# Patient Record
Sex: Female | Born: 1950 | Race: Black or African American | Hispanic: No | Marital: Single | Smoking: Current every day smoker
Health system: Southern US, Community
[De-identification: ages and names within clinical notes are randomized; demographics above are authoritative.]

## PROBLEM LIST (undated history)

## (undated) DIAGNOSIS — E785 Hyperlipidemia, unspecified: Secondary | ICD-10-CM

## (undated) DIAGNOSIS — K635 Polyp of colon: Secondary | ICD-10-CM

## (undated) DIAGNOSIS — I10 Essential (primary) hypertension: Secondary | ICD-10-CM

## (undated) DIAGNOSIS — E119 Type 2 diabetes mellitus without complications: Secondary | ICD-10-CM

## (undated) HISTORY — DX: Polyp of colon: K63.5

## (undated) HISTORY — DX: Hyperlipidemia, unspecified: E78.5

## (undated) HISTORY — DX: Essential (primary) hypertension: I10

## (undated) HISTORY — PX: CHOLECYSTECTOMY: SHX55

## (undated) HISTORY — DX: Type 2 diabetes mellitus without complications: E11.9

---

## 2005-01-04 ENCOUNTER — Ambulatory Visit: Payer: Self-pay | Admitting: Unknown Physician Specialty

## 2005-01-05 ENCOUNTER — Ambulatory Visit: Payer: Self-pay | Admitting: Unknown Physician Specialty

## 2005-02-05 ENCOUNTER — Ambulatory Visit: Payer: Self-pay | Admitting: Unknown Physician Specialty

## 2005-10-14 ENCOUNTER — Ambulatory Visit: Payer: Self-pay | Admitting: Family Medicine

## 2006-03-10 ENCOUNTER — Ambulatory Visit: Payer: Self-pay | Admitting: General Surgery

## 2007-05-17 ENCOUNTER — Ambulatory Visit: Payer: Self-pay

## 2007-08-27 ENCOUNTER — Ambulatory Visit: Payer: Self-pay

## 2008-07-22 ENCOUNTER — Ambulatory Visit: Payer: Self-pay

## 2008-08-11 ENCOUNTER — Ambulatory Visit: Payer: Self-pay | Admitting: General Surgery

## 2008-08-15 ENCOUNTER — Ambulatory Visit: Payer: Self-pay | Admitting: General Surgery

## 2008-11-07 HISTORY — PX: COLONOSCOPY: SHX174

## 2009-01-10 IMAGING — RF DG BE W/ AIR HIGH DENSITY
1 series · 15 of 18 positions shown · non-contrast
Comparison: none

REASON FOR EXAM: hx of colon polyps recent weight loss
COMMENTS:

[Series 1: run · 15 of 18 slices shown]
[im 1/18]
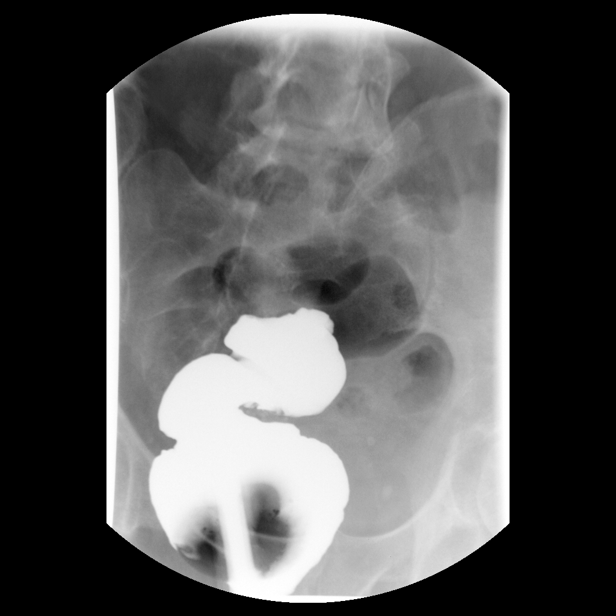
[im 2/18]
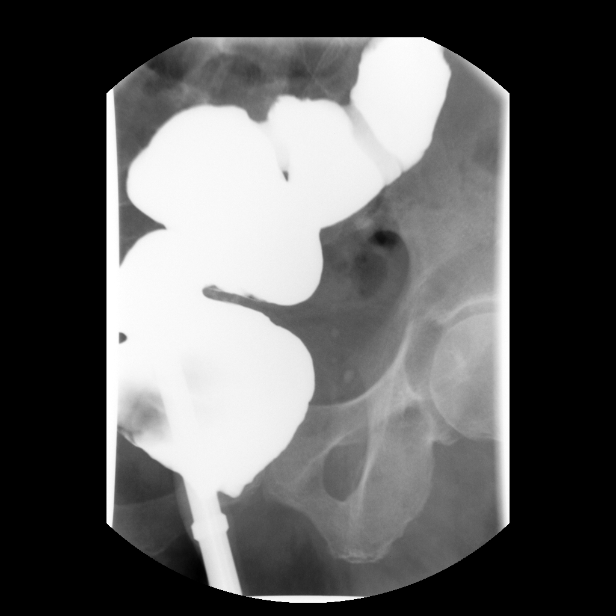
[im 4/18]
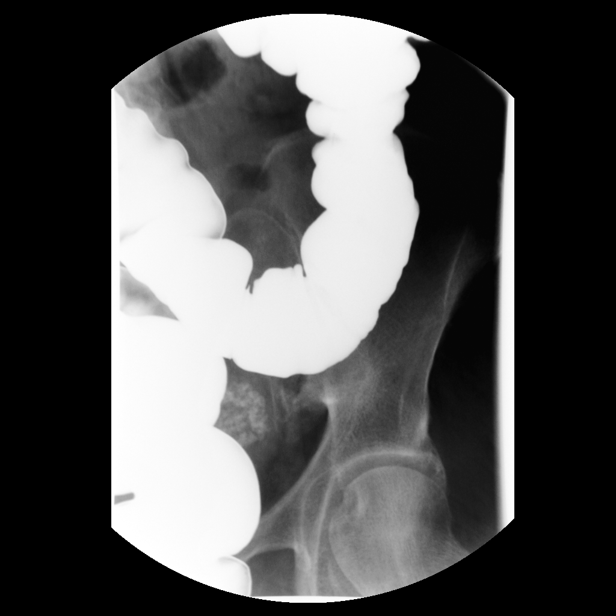
[im 5/18]
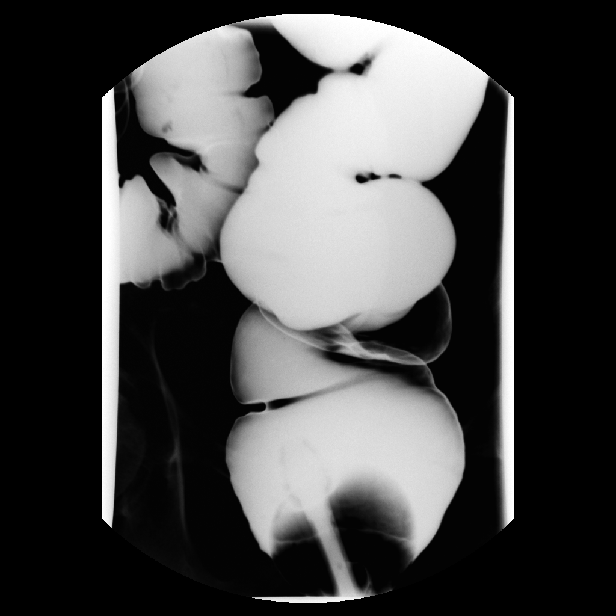
[im 6/18]
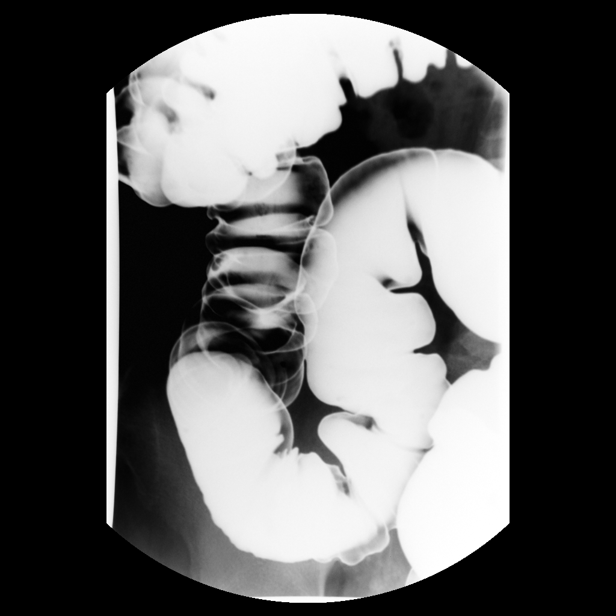
[im 7/18]
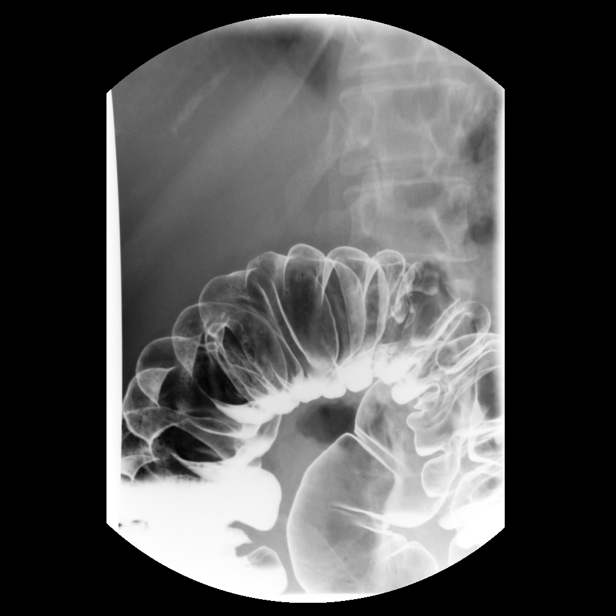
[im 8/18]
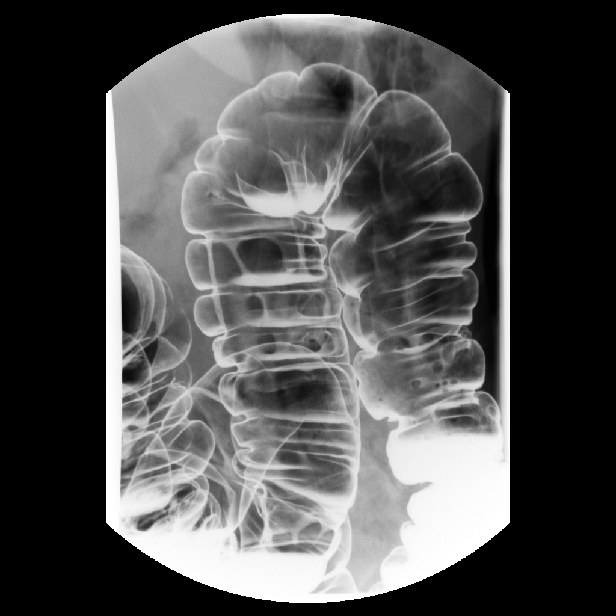
[im 10/18]
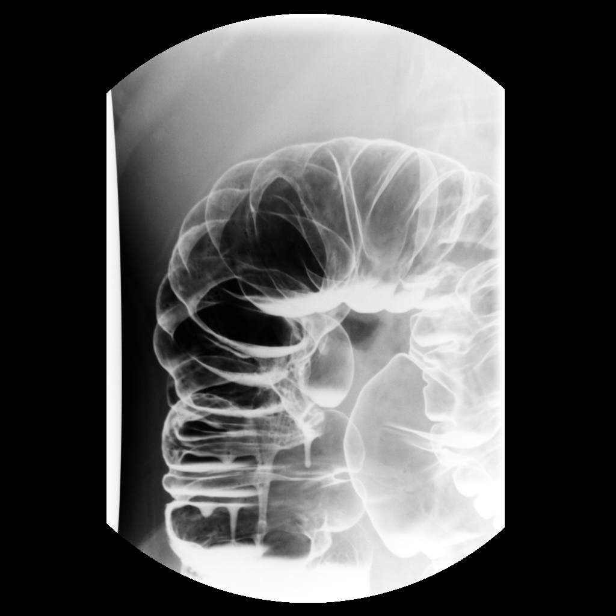
[im 11/18]
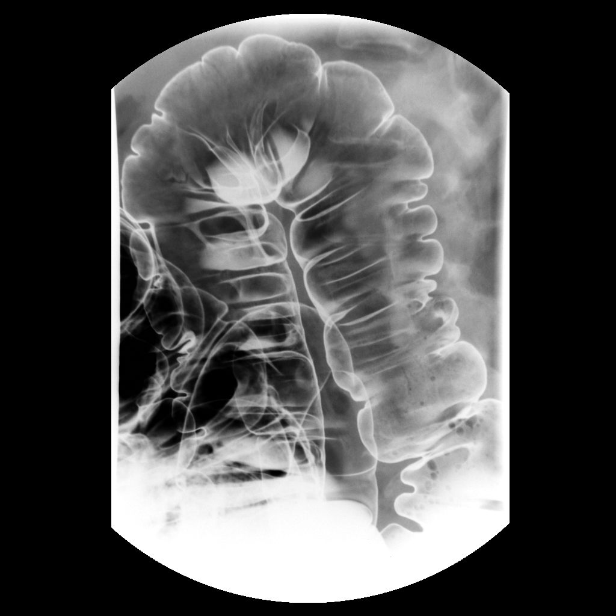
[im 12/18]
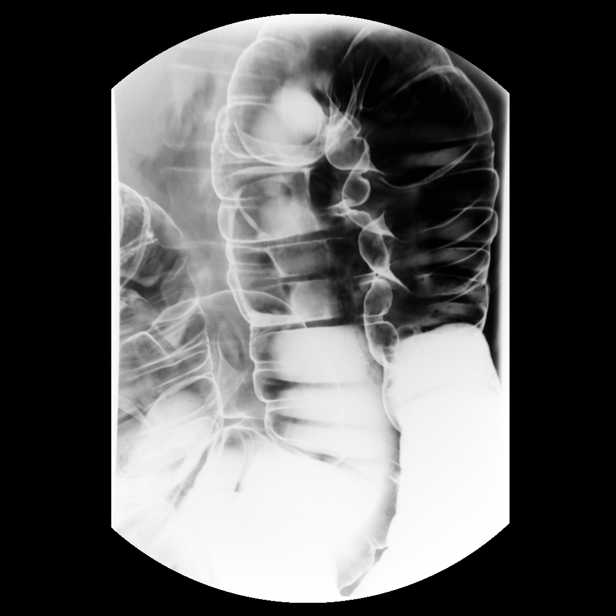
[im 13/18]
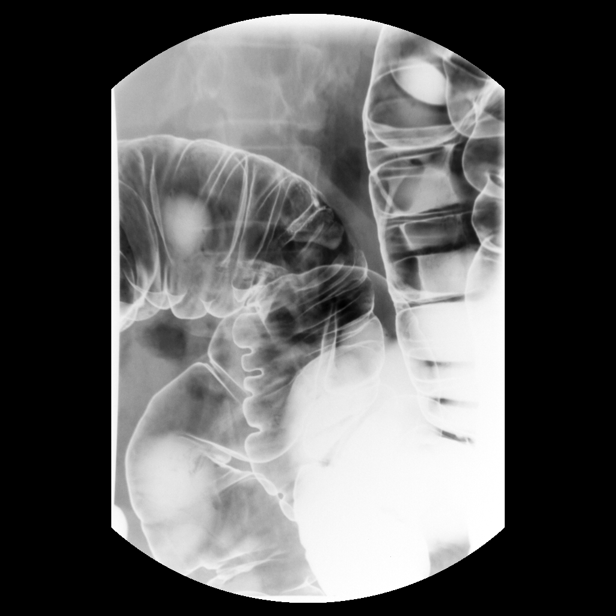
[im 14/18]
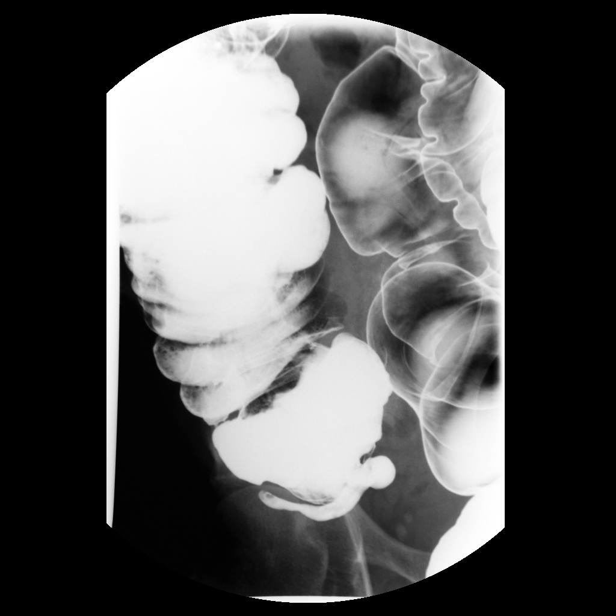
[im 16/18]
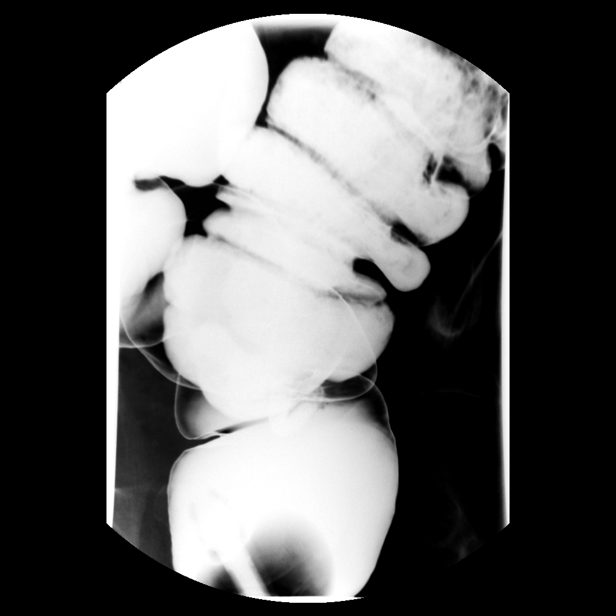
[im 17/18]
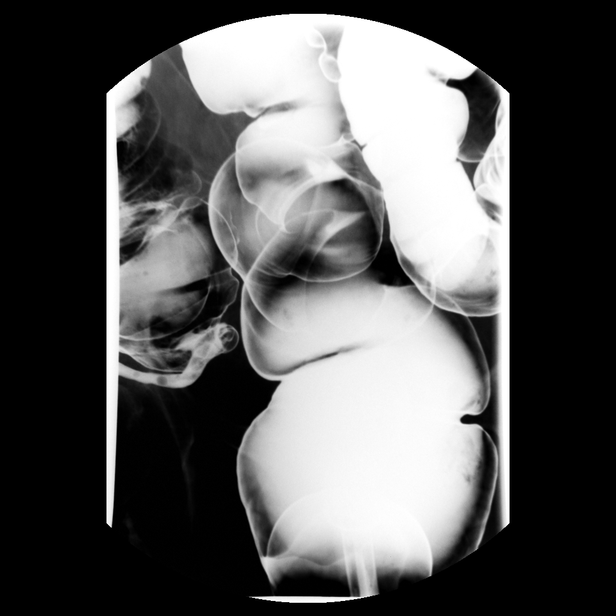
[im 18/18]
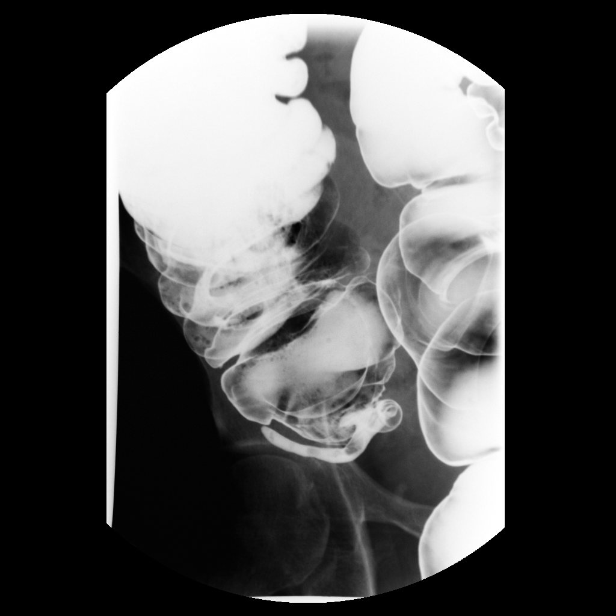

[15 of 18 positions shown; findings below may reference images not displayed]

PROCEDURE:     FL  - FL BARIUM ENEMA WITH AIR COLON  - August 15, 2008 [DATE]

RESULT:     Scout film reveals calcifications over the pelvis possibly
within a fibroid. Calcified pelvic phleboliths are also noted. Barium was
introduced per rectum to the level of the distal cecum and appendix and
terminal ileum. Filling defects are noted in the RIGHT colon. These cannot
be cleared on spot films and overhead films. Although this could represent
residual stool, given the patient's history, colonoscopy should be
reattempted particularly to evaluate the RIGHT colon for polypoid lesions.
No fixed polypoid lesions or mass lesion is noted in the transverse colon,
LEFT colon and rectosigmoid.
IMPRESSION: 1.     Fibroid uterus.
2.     Small polypoid filling defects in the mid to distal RIGHT colon.
These could not be completely cleared. This could represent residual stool
or polyps. Given the patient's history of polyps and weight loss, re-attempt
at colonoscopy to completely evaluate the RIGHT colon is suggested. The
remainder of the colon appeared clear.

## 2009-02-26 ENCOUNTER — Ambulatory Visit: Payer: Self-pay | Admitting: General Surgery

## 2009-10-19 ENCOUNTER — Ambulatory Visit: Payer: Self-pay | Admitting: Emergency Medicine

## 2010-03-21 ENCOUNTER — Emergency Department: Payer: Self-pay | Admitting: Emergency Medicine

## 2010-12-07 ENCOUNTER — Ambulatory Visit: Payer: Self-pay | Admitting: Family

## 2014-08-12 ENCOUNTER — Encounter: Payer: Self-pay | Admitting: General Surgery

## 2014-08-13 ENCOUNTER — Ambulatory Visit: Payer: Self-pay

## 2014-08-25 ENCOUNTER — Encounter: Payer: Self-pay | Admitting: General Surgery

## 2014-08-25 ENCOUNTER — Ambulatory Visit (INDEPENDENT_AMBULATORY_CARE_PROVIDER_SITE_OTHER): Payer: Medicare Other | Admitting: General Surgery

## 2014-08-25 VITALS — BP 110/80 | HR 84 | Resp 14 | Ht 66.0 in | Wt 119.0 lb

## 2014-08-25 DIAGNOSIS — Z86010 Personal history of colonic polyps: Secondary | ICD-10-CM

## 2014-08-25 MED ORDER — POLYETHYLENE GLYCOL 3350 17 GM/SCOOP PO POWD: ORAL | Status: AC

## 2014-08-25 NOTE — Progress Notes (Signed)
Patient ID: Terri Ochoa, female   DOB: [birth year], 63 y.o.   MRN: [id number]  Chief Complaint  Patient presents with  . Other    colonoscopy screening    HPI Terri Ochoa is a 63 y.o. female here today for a evaluation of surveillance colonoscopy. Patient last colonoscopy was done in 2010. The patient denies any problems at this time. She has a history of colon polyps.    HPI  Past Medical History  Diagnosis Date  . Diabetes mellitus without complication   . Hypertension   . Hyperlipidemia   . Colon polyps     Past Surgical History  Procedure Laterality Date  . Colonoscopy  2010  . Cholecystectomy      History reviewed. No pertinent family history.  Social History History  Substance Use Topics  . Smoking status: Current Every Day Smoker -- 1.00 packs/day for 30 years    Types: Cigarettes  . Smokeless tobacco: Not on file  . Alcohol Use: No    No Known Allergies  Current Outpatient Prescriptions  Medication Sig Dispense Refill  . aspirin 325 MG tablet Take 325 mg by mouth daily.      . insulin NPH Human (HUMULIN N,NOVOLIN N) 100 UNIT/ML injection 10 units in AM 12 units in PM      . lisinopril-hydrochlorothiazide (PRINZIDE,ZESTORETIC) 20-12.5 MG per tablet Take 1 tablet by mouth daily.      Marland Kitchen. lovastatin (MEVACOR) 20 MG tablet Take 20 mg by mouth at bedtime.      . metFORMIN (GLUCOPHAGE) 500 MG tablet Take 500 mg by mouth 2 (two) times daily with a meal.      . polyethylene glycol powder (GLYCOLAX/MIRALAX) powder 255 grams one bottle for colonoscopy prep  255 g  0   No current facility-administered medications for this visit.    Review of Systems Review of Systems  Constitutional: Negative.   Respiratory: Negative.   Cardiovascular: Negative.   Gastrointestinal: Negative.     Blood pressure 110/80, pulse 84, resp. rate 14, height 5\' 6"  (1.676 m), weight 119 lb (53.978 kg).  Physical Exam Physical Exam  Constitutional: She is oriented to person,  place, and time. She appears well-developed and well-nourished.  Eyes: Conjunctivae are normal. No scleral icterus.  Neck: Neck supple. No thyromegaly present.  Cardiovascular: Normal rate, regular rhythm and normal heart sounds.   No murmur heard. Pulmonary/Chest: Effort normal and breath sounds normal.  Lymphadenopathy:    She has no cervical adenopathy.  Neurological: She is alert and oriented to person, place, and time.  Skin: Skin is warm and dry.    Data Reviewed Prior notes  Assessment    History of colon polyps. Surveillance colonoscopy due. Pt agreeable.     Plan    Patient [facility]. This patient will not take metformin the day of prep or procedure. Patient may take morning dose of insulin the day of prep but will not take the evening dose. Patient will only take blood pressure medication the morning of scheduled colonoscopy.        SANKAR,SEEPLAPUTHUR G 08/26/2014, 7:59 AM

## 2014-08-25 NOTE — Patient Instructions (Addendum)
Colonoscopy A colonoscopy is an exam to look at the entire large intestine (colon). This exam can help find problems such as tumors, polyps, inflammation, and areas of bleeding. The exam takes about 1 hour.  LET [facility]ARE PROVIDER KNOW ABOUT:   Any allergies you have.  All medicines you are taking, including vitamins, herbs, eye drops, creams, and over-the-counter medicines.  Previous problems you or members of your family have had with the use of anesthetics.  Any blood disorders you have.  Previous surgeries you have had.  Medical conditions you have. RISKS AND COMPLICATIONS  Generally, this is a safe procedure. However, as with any procedure, complications can occur. Possible complications include:  Bleeding.  Tearing or rupture of the colon wall.  Reaction to medicines given during the exam.  Infection (rare). BEFORE THE PROCEDURE   Ask your health care provider about changing or stopping your regular medicines.  You may be prescribed an oral bowel prep. This involves drinking a large amount of medicated liquid, starting the day before your procedure. The liquid will cause you to have multiple loose stools until your stool is almost clear or light green. This cleans out your colon in preparation for the procedure.  Do not eat or drink anything else once you have started the bowel prep, unless your health care provider tells you it is safe to do so.  Arrange for someone to drive you home after the procedure. PROCEDURE   You will be given medicine to help you relax (sedative).  You will lie on your side with your knees bent.  A long, flexible tube with a light and camera on the end (colonoscope) will be inserted through the rectum and into the colon. The camera sends video back to a computer screen as it moves through the colon. The colonoscope also releases carbon dioxide gas to inflate the colon. This helps your health care provider see the area better.  During  the exam, your health care provider may take a small tissue sample (biopsy) to be examined under a microscope if any abnormalities are found.  The exam is finished when the entire colon has been viewed. AFTER THE PROCEDURE   Do not drive for 24 hours after the exam.  You may have a small amount of blood in your stool.  You may pass moderate amounts of gas and have mild abdominal cramping or bloating. This is caused by the gas used to inflate your colon during the exam.  Ask when your test results will be ready and how you will get your results. Make sure you get your test results. Document Released: 10/21/2000 Document Revised: 08/14/2013 Document Reviewed: 07/01/2013 [facility] Patient Information 2015 [employer] [place]. This information is not intended to replace advice given to you by your health care provider. Make sure you discuss any questions you have with your health care provider.  [facility]. This patient will not take metformin the day of prep or procedure. Patient may take morning dose of insulin the day of prep but will not take the evening dose. Patient will only take blood pressure medication the morning of scheduled colonoscopy.

## 2014-08-26 ENCOUNTER — Encounter: Payer: Self-pay | Admitting: General Surgery

## 2014-09-08 ENCOUNTER — Other Ambulatory Visit: Payer: Self-pay | Admitting: General Surgery

## 2014-09-08 DIAGNOSIS — Z86010 Personal history of colonic polyps: Secondary | ICD-10-CM

## 2014-09-09 ENCOUNTER — Telehealth: Payer: Self-pay

## 2014-09-09 NOTE — Telephone Encounter (Signed)
Message received from Dr Evette CristalSankar for patient to hold her 325 mg ASA today and tomorrow for her colonoscopy scheduled for 09/10/14. Patient is amendable to this and is aware of instructions.

## 2014-09-10 ENCOUNTER — Ambulatory Visit: Payer: Self-pay | Admitting: General Surgery

## 2014-09-10 DIAGNOSIS — Z86010 Personal history of colonic polyps: Secondary | ICD-10-CM

## 2014-09-11 ENCOUNTER — Encounter: Payer: Self-pay | Admitting: General Surgery

## 2016-06-20 ENCOUNTER — Other Ambulatory Visit: Payer: Self-pay | Admitting: Obstetrics and Gynecology

## 2016-06-20 DIAGNOSIS — Z1231 Encounter for screening mammogram for malignant neoplasm of breast: Secondary | ICD-10-CM

## 2017-07-13 ENCOUNTER — Encounter: Payer: Self-pay | Admitting: Emergency Medicine

## 2017-07-13 ENCOUNTER — Emergency Department: Payer: Medicare PPO

## 2017-07-13 ENCOUNTER — Emergency Department
Admission: EM | Admit: 2017-07-13 | Discharge: 2017-07-13 | Disposition: A | Discharge status: Home / Self Care | Payer: Medicare PPO | Attending: Emergency Medicine | Admitting: Emergency Medicine

## 2017-07-13 DIAGNOSIS — Z23 Encounter for immunization: Secondary | ICD-10-CM | POA: Insufficient documentation

## 2017-07-13 DIAGNOSIS — S61211A Laceration without foreign body of left index finger without damage to nail, initial encounter: Secondary | ICD-10-CM | POA: Diagnosis not present

## 2017-07-13 DIAGNOSIS — Y92007 Garden or yard of unspecified non-institutional (private) residence as the place of occurrence of the external cause: Secondary | ICD-10-CM | POA: Insufficient documentation

## 2017-07-13 DIAGNOSIS — Z794 Long term (current) use of insulin: Secondary | ICD-10-CM | POA: Diagnosis not present

## 2017-07-13 DIAGNOSIS — S61213A Laceration without foreign body of left middle finger without damage to nail, initial encounter: Secondary | ICD-10-CM | POA: Insufficient documentation

## 2017-07-13 DIAGNOSIS — Z79899 Other long term (current) drug therapy: Secondary | ICD-10-CM | POA: Insufficient documentation

## 2017-07-13 DIAGNOSIS — Y999 Unspecified external cause status: Secondary | ICD-10-CM | POA: Diagnosis not present

## 2017-07-13 DIAGNOSIS — I10 Essential (primary) hypertension: Secondary | ICD-10-CM | POA: Insufficient documentation

## 2017-07-13 DIAGNOSIS — Y93H2 Activity, gardening and landscaping: Secondary | ICD-10-CM | POA: Diagnosis not present

## 2017-07-13 DIAGNOSIS — W293XXA Contact with powered garden and outdoor hand tools and machinery, initial encounter: Secondary | ICD-10-CM | POA: Diagnosis not present

## 2017-07-13 DIAGNOSIS — S61217A Laceration without foreign body of left little finger without damage to nail, initial encounter: Secondary | ICD-10-CM | POA: Diagnosis not present

## 2017-07-13 DIAGNOSIS — F17210 Nicotine dependence, cigarettes, uncomplicated: Secondary | ICD-10-CM | POA: Insufficient documentation

## 2017-07-13 DIAGNOSIS — S61215A Laceration without foreign body of left ring finger without damage to nail, initial encounter: Secondary | ICD-10-CM

## 2017-07-13 DIAGNOSIS — E119 Type 2 diabetes mellitus without complications: Secondary | ICD-10-CM | POA: Diagnosis not present

## 2017-07-13 DIAGNOSIS — Z7982 Long term (current) use of aspirin: Secondary | ICD-10-CM | POA: Insufficient documentation

## 2017-07-13 MED ORDER — LIDOCAINE HCL (PF) 1 % IJ SOLN
10.0000 mL | Freq: Once | INTRAMUSCULAR | Status: AC
  Administered 2017-07-13: 10 mL
  Filled 2017-07-13: qty 10

## 2017-07-13 MED ORDER — TRAMADOL HCL 50 MG PO TABS
50.0000 mg | ORAL_TABLET | Freq: Once | ORAL | Status: AC
  Administered 2017-07-13: 50 mg via ORAL
  Filled 2017-07-13: qty 1

## 2017-07-13 MED ORDER — TETANUS-DIPHTH-ACELL PERTUSSIS 5-2.5-18.5 LF-MCG/0.5 IM SUSP
0.5000 mL | Freq: Once | INTRAMUSCULAR | Status: AC
  Administered 2017-07-13: 0.5 mL via INTRAMUSCULAR
  Filled 2017-07-13: qty 0.5

## 2017-07-13 MED ORDER — SULFAMETHOXAZOLE-TRIMETHOPRIM 800-160 MG PO TABS
1.0000 | ORAL_TABLET | Freq: Once | ORAL | Status: AC
  Administered 2017-07-13: 1 via ORAL
  Filled 2017-07-13: qty 1

## 2017-07-13 MED ORDER — SULFAMETHOXAZOLE-TRIMETHOPRIM 800-160 MG PO TABS: 1.0000 | ORAL_TABLET | Freq: Two times a day (BID) | ORAL | 0 refills | Status: AC

## 2017-07-13 MED ORDER — TRAMADOL HCL 50 MG PO TABS: 50.0000 mg | ORAL_TABLET | Freq: Two times a day (BID) | ORAL | 0 refills | Status: AC

## 2017-07-13 NOTE — ED Triage Notes (Signed)
Patient presents to ED via POV from home. Patient was doing yard work, patient states, "I smashed the button on the weed wacker and I forgot my hand was on the blade". Significant trauma noted to four left fingers (all except thumb). Worst injury noted to left ring finger. Soft tissue visible.

## 2017-07-13 NOTE — ED Notes (Signed)
Pt ambulatory to toilet by self.  

## 2017-07-13 NOTE — Discharge Instructions (Signed)
Your multiple finger lacerations have been either sutured (stitches) or glued with wound adhesive. Keep the wounds clean, dry, and covered. [facility] in 10-12 days. Take Tylenol or Motrin for pain relief.

## 2017-07-13 NOTE — ED Notes (Signed)
See triage note  States she was using a weed wacker while doing yard work  States she forgot and hit the button  Lacerations noted to 2nd,3rd ,4th and 5 th fingers

## 2017-07-14 NOTE — ED Provider Notes (Signed)
[facility] Emergency Department Provider Note ____________________________________________  Time seen: 1532  I have reviewed the triage vital signs and the nursing notes.  HISTORY  Chief Complaint  Laceration  HPI Terri Ochoa is a 66 y.o. female Presents to the ED from home, for evaluation and management of lacerations to several fingers. Patient describes she was using a weed whacker, and forgot that her hand was near the blade when she engaged the safety button. She sustained multiple lacerations across all 4 of herfingers. She presents now with bleeding controlled, for evaluation management. She is unclear for current tetanus status. She does report normal range of motion and gross sensation to all the fingers. She has history of diabetes and hypertension.  Past Medical History:  Diagnosis Date  . Colon polyps   . Diabetes mellitus without complication (HCC)   . Hyperlipidemia   . Hypertension     There are no active problems to display for this patient.   Past Surgical History:  Procedure Laterality Date  . CHOLECYSTECTOMY    . COLONOSCOPY  2010    Prior to Admission medications   Medication Sig Start Date End Date Taking? Authorizing Provider  aspirin 325 MG tablet Take 325 mg by mouth daily.    [provider]  insulin NPH Human (HUMULIN N,NOVOLIN N) 100 UNIT/ML injection 10 units in AM 12 units in PM    [provider]  lisinopril-hydrochlorothiazide (PRINZIDE,ZESTORETIC) 20-12.5 MG per tablet Take 1 tablet by mouth daily.    [provider]  lovastatin (MEVACOR) 20 MG tablet Take 20 mg by mouth at bedtime.    [provider]  metFORMIN (GLUCOPHAGE) 500 MG tablet Take 500 mg by mouth 2 (two) times daily with a meal.    [provider]  polyethylene glycol powder (GLYCOLAX/MIRALAX) powder 255 grams one bottle for colonoscopy prep 08/25/14   Kieth Brightly, MD  sulfamethoxazole-trimethoprim  (BACTRIM DS,SEPTRA DS) 800-160 MG tablet Take 1 tablet by mouth 2 (two) times daily. 07/13/17   Jonanthony Nahar, Charlesetta Ivory, PA-C  traMADol (ULTRAM) 50 MG tablet Take 1 tablet (50 mg total) by mouth 2 (two) times daily. 07/13/17   Robertson Colclough, Charlesetta Ivory, PA-C    Allergies Patient has no known allergies.  No family history on file.  Social History Social History  Substance Use Topics  . Smoking status: Current Every Day Smoker    Packs/day: 1.00    Years: 30.00    Types: Cigarettes  . Smokeless tobacco: Never Used  . Alcohol use No    Review of Systems  Constitutional: Negative for fever. Musculoskeletal: Negative for back pain. Skin: Negative for rash. Multiple lacerations to her fingers of the left hand. Neurological: Negative for headaches, focal weakness or numbness. ____________________________________________  PHYSICAL EXAM:  VITAL SIGNS: ED Triage Vitals  Enc Vitals Group     BP 07/13/17 1523 (!) 148/70     Pulse Rate 07/13/17 1523 99     Resp 07/13/17 1523 17     Temp 07/13/17 1523 98.1 F (36.7 C)     Temp src --      SpO2 07/13/17 1523 95 %     Weight 07/13/17 1523 120 lb (54.4 kg)     Height 07/13/17 1523  (1.676 m)     Head Circumference --      Peak Flow --      Pain Score 07/13/17 1522 10     Pain Loc --  Pain Edu? --      Excl. in GC? --     Constitutional: Alert and oriented. Well appearing and in no distress. Head: Normocephalic and atraumatic. Cardiovascular: Normal rate, regular rhythm. Normal distal pulses. Respiratory: Normal respiratory effort. No wheezes/rales/rhonchi. Musculoskeletal: Normal composite fist on the left.Nontender with normal range of motion in all extremities.  Neurologic:  Normal gross sensation. Normal intrinsic and opposition testing. Normal speech and language. No gross focal neurologic deficits are appreciated. Skin:  Skin is warm, dry and intact. No rash noted. ___________________________________________    RADIOLOGY  Left Hand  IMPRESSION: 1. Lacerations in the distal fingers. 2. High attenuation focus adjacent to the distal tuft of the fifth finger could represent soft tissue calcification versus of foreign body given the lacerations. 3. High attenuation in the soft tissues adjacent to the distal third interphalangeal joint appears to be associated with the joint and could represent a subtle fracture or degenerative change. Recommend clinical correlation.  I, Bary Limbach, Charlesetta IvoryJenise V Bacon, personally viewed and evaluated these images (plain radiographs) as part of my medical decision making, as well as reviewing the written report by the radiologist. ____________________________________________  PROCEDURES  Tdap 0.5 ml IM Bactrim DS 1 PO Ultram 50 mg PO  LACERATION REPAIR Performed by: Lester CarolinaMicheal Whitehurst, PA-S Sherrie Sport([place]) Assisted by: Lissa HoardMenshew, Larraine Argo V Bacon Authorized by: Lissa HoardMenshew, Gabbie Marzo V Bacon Consent: Verbal consent obtained. Risks and benefits: risks, benefits and alternatives were discussed Consent given by: patient Patient identity confirmed: provided demographic data Prepped and Draped in normal sterile fashion Wound explored  Laceration Location: left index/middle/ring/pinky (2nd-5th)  Laceration Length: index 1.5 total linear cm                                Middle 2 total linear cm                                Ring 5 total linear cm                                Pinky 3 total linear cm  No Foreign Bodies seen or palpated  Anesthesia: transthecal infiltration  Local anesthetic: lidocaine 1% w/o epinephrine  Anesthetic total: 10 ml  Irrigation method: syringe Amount of cleaning: standard  Skin closure: 5-0 nylon + Dermabond  Number of sutures: 33 total   Technique: interrupted  Patient tolerance: Patient tolerated the procedure well with no immediate complications. Adequate skin edge approximation was achieved of the very complex, tedious multiple  lacerations across 4 fingers.  ____________________________________________  INITIAL IMPRESSION / ASSESSMENT AND PLAN / ED COURSE  Patient was ED evaluation of multiple superficial lacerations involving the fingertips and fat pads of her left second through fifth fingers. Patient tolerates procedure well and adequate wound repair is achieved. She is discharged with a prescription for Ultram and Bactrim dose as directed. Wound care structures are provided, and she will follow with her primary care provider for ongoing wound check and subsequent suture removal. ____________________________________________  FINAL CLINICAL IMPRESSION(S) / ED DIAGNOSES  Final diagnoses:  Laceration of left ring finger without foreign body without damage to nail, initial encounter  Laceration of left index finger without foreign body without damage to nail, initial encounter  Laceration of left middle finger without foreign body without damage to nail, initial encounter  Laceration of left little finger without foreign body without damage to nail, initial encounter      Lissa Hoard, PA-C 07/14/17 0154    Rockne Menghini, MD 07/18/17 2137

## 2017-09-25 ENCOUNTER — Other Ambulatory Visit: Payer: Self-pay | Admitting: Obstetrics and Gynecology

## 2017-09-25 DIAGNOSIS — Z1231 Encounter for screening mammogram for malignant neoplasm of breast: Secondary | ICD-10-CM

## 2017-10-17 ENCOUNTER — Inpatient Hospital Stay: Admission: RE | Admit: 2017-10-17 | Payer: Medicare PPO | Source: Ambulatory Visit

## 2017-12-08 IMAGING — DX DG HAND COMPLETE 3+V*L*
3 series · 3 of 3 positions shown · non-contrast
Comparison: None.

CLINICAL DATA: Lacerations.

EXAM:
LEFT HAND - COMPLETE 3+ VIEW

[hand ap]
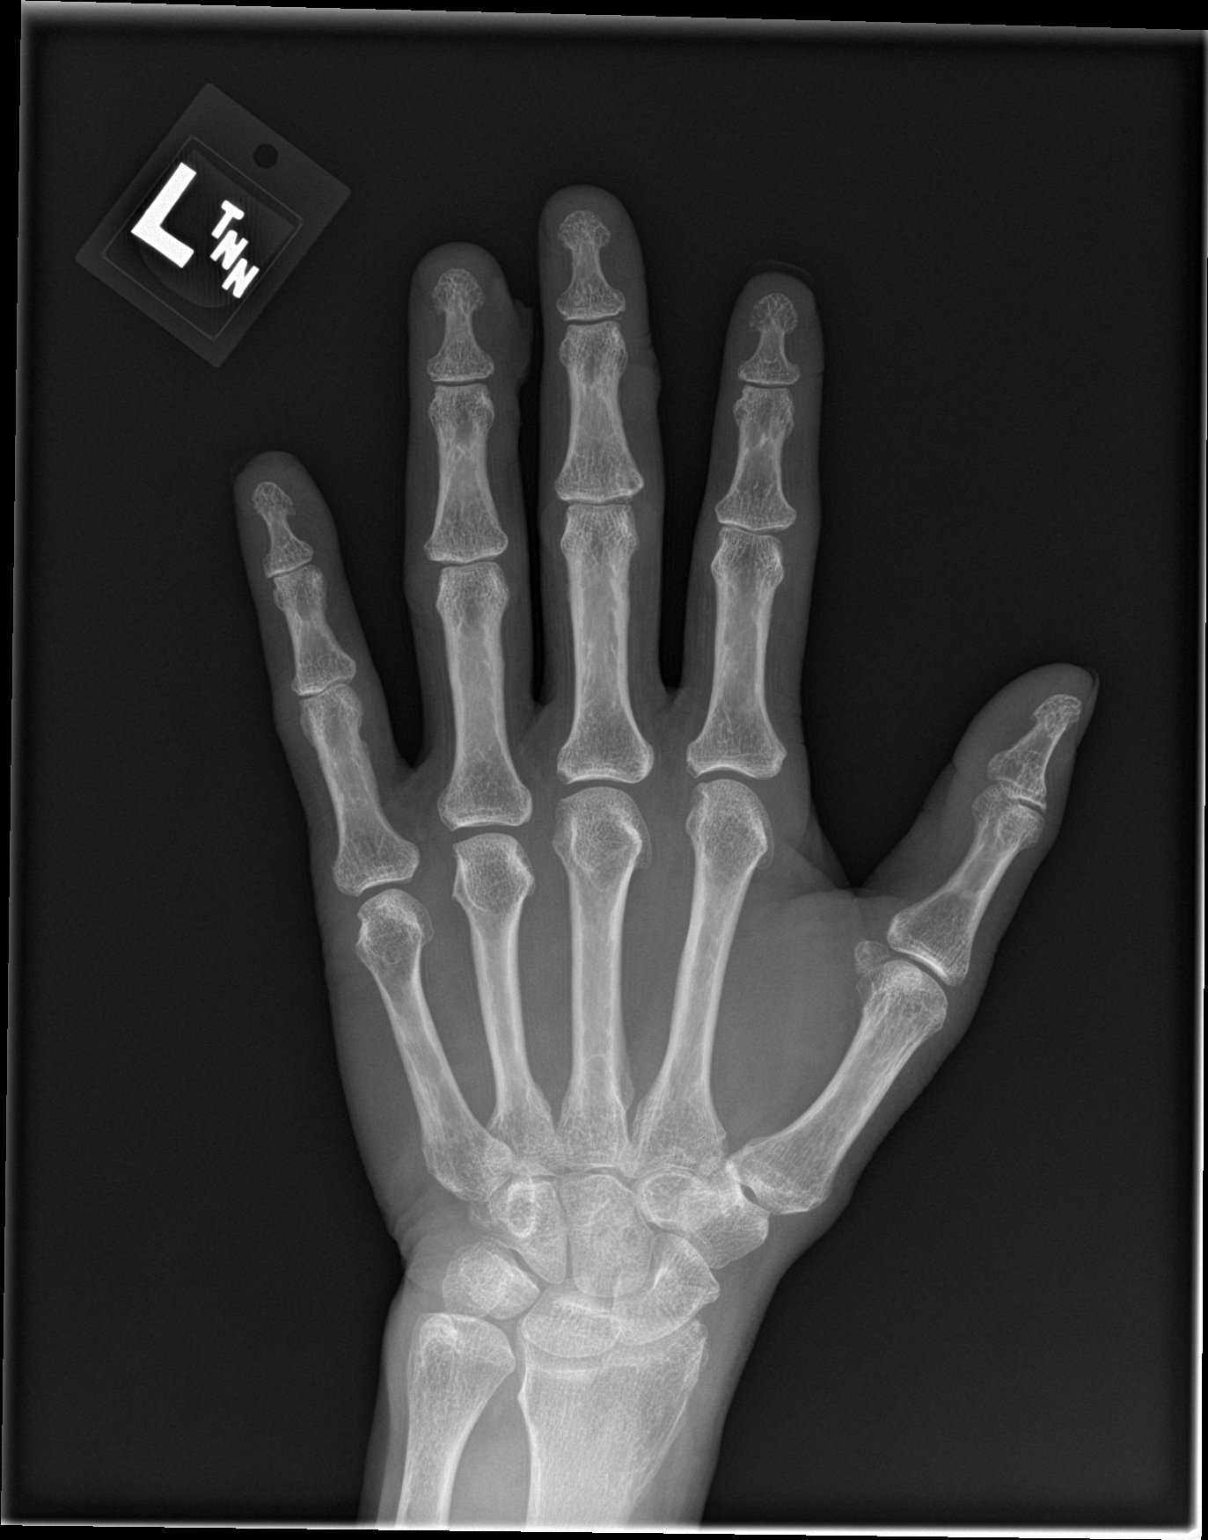

[hand obl]
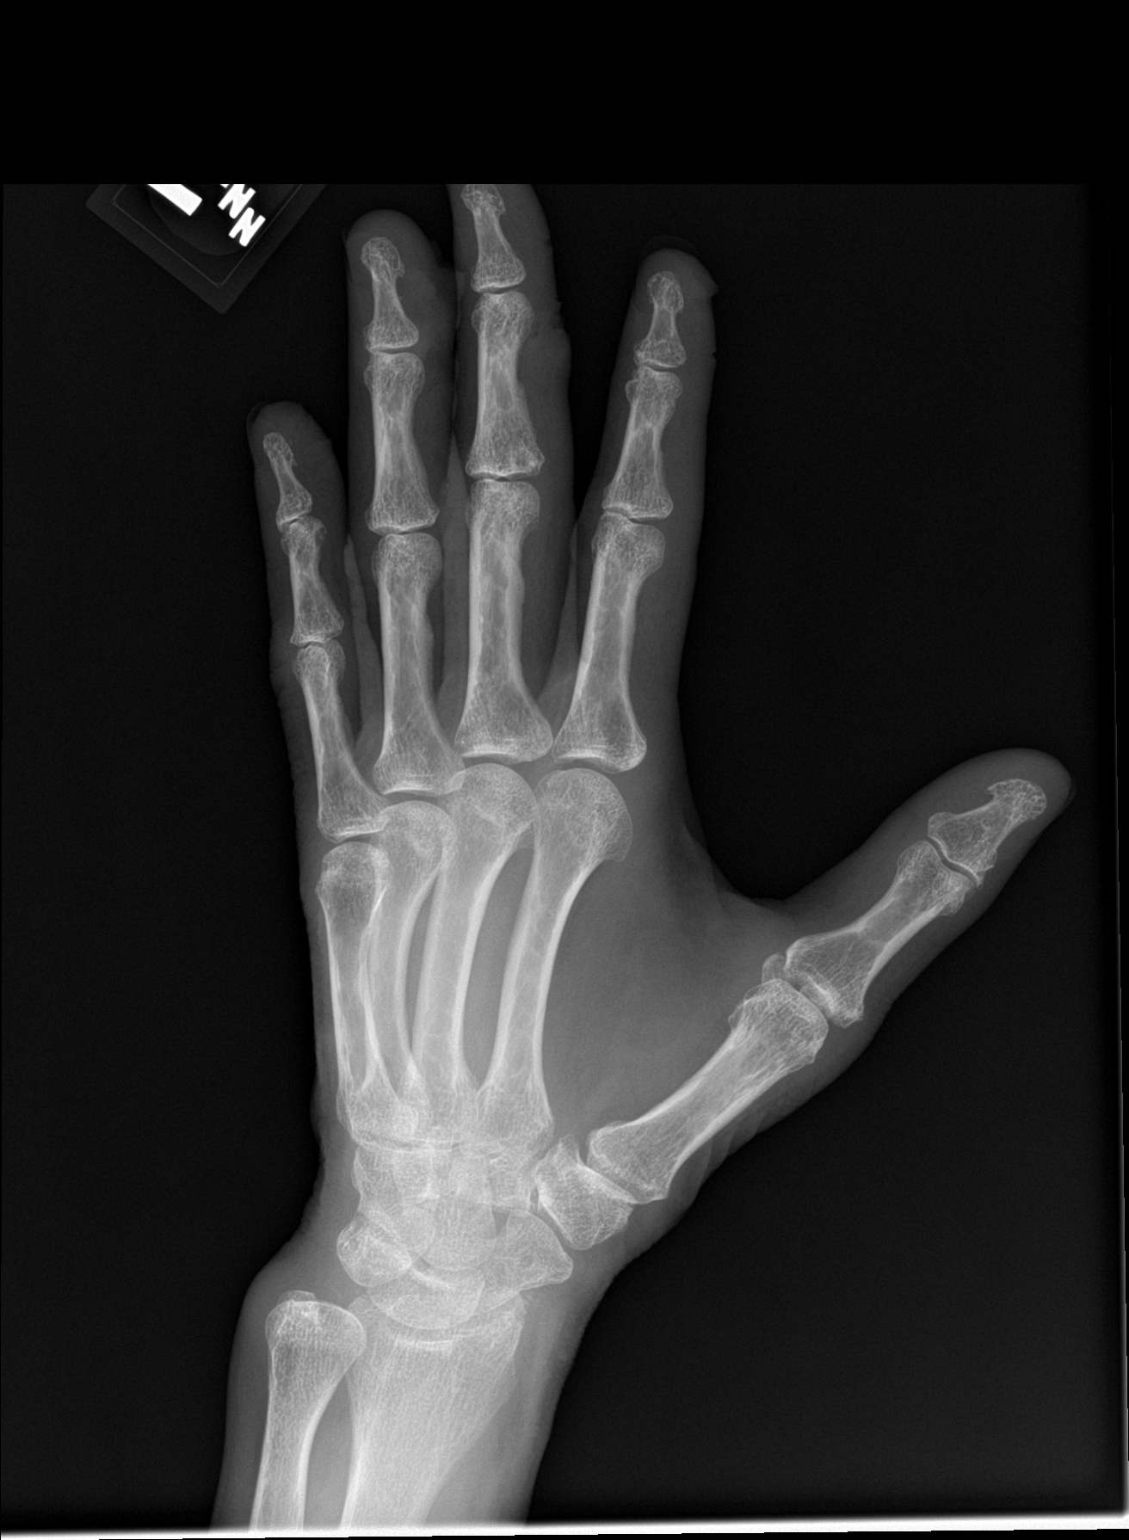

[hand lat]
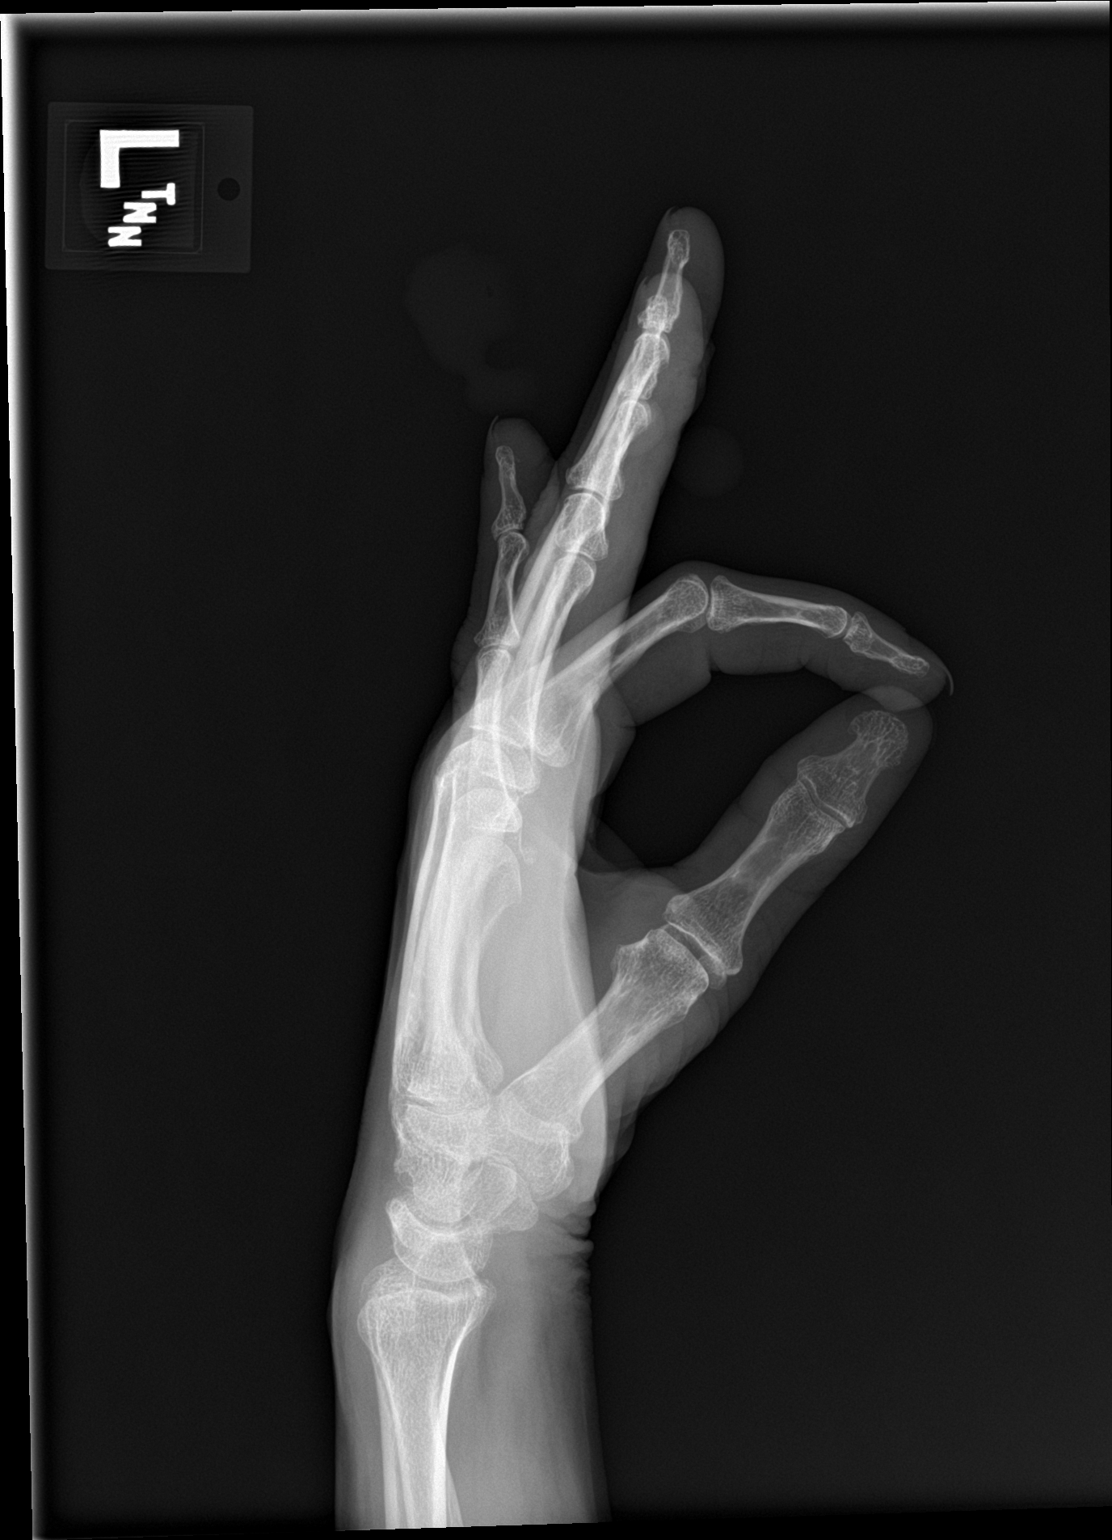

[3 of 3 positions shown; findings below may reference images not displayed]

FINDINGS: Lacerations are seen in the fingers distally. High attenuation
adjacent to the distal tuft of the fifth phalanx on two views is
nonspecific but could represent a foreign body or soft tissue
calcification. A tiny calcification along the radial aspect of the
third distal interphalangeal joint is best seen on the AP view. No
other abnormalities.
IMPRESSION: 1. Lacerations in the distal fingers.
2. High attenuation focus adjacent to the distal tuft of the fifth
finger could represent soft tissue calcification versus of foreign
body given the lacerations.
3. High attenuation in the soft tissues adjacent to the distal third
interphalangeal joint appears to be associated with the joint and
could represent a subtle fracture or degenerative change. Recommend
clinical correlation.

## 2018-05-21 ENCOUNTER — Other Ambulatory Visit: Payer: Self-pay | Admitting: Obstetrics and Gynecology

## 2018-05-21 DIAGNOSIS — Z1231 Encounter for screening mammogram for malignant neoplasm of breast: Secondary | ICD-10-CM

## 2019-09-24 ENCOUNTER — Other Ambulatory Visit: Payer: Self-pay | Admitting: Obstetrics and Gynecology

## 2019-09-24 DIAGNOSIS — Z1231 Encounter for screening mammogram for malignant neoplasm of breast: Secondary | ICD-10-CM

## 2019-11-28 ENCOUNTER — Other Ambulatory Visit: Payer: Self-pay | Admitting: Obstetrics and Gynecology

## 2019-11-28 DIAGNOSIS — Z1231 Encounter for screening mammogram for malignant neoplasm of breast: Secondary | ICD-10-CM

## 2022-08-11 ENCOUNTER — Other Ambulatory Visit: Payer: Self-pay | Admitting: Obstetrics and Gynecology

## 2022-08-11 DIAGNOSIS — Z1231 Encounter for screening mammogram for malignant neoplasm of breast: Secondary | ICD-10-CM

## 2022-09-16 ENCOUNTER — Other Ambulatory Visit: Payer: Self-pay | Admitting: *Deleted

## 2022-09-16 DIAGNOSIS — F17210 Nicotine dependence, cigarettes, uncomplicated: Secondary | ICD-10-CM

## 2022-09-16 DIAGNOSIS — Z87891 Personal history of nicotine dependence: Secondary | ICD-10-CM

## 2022-09-16 DIAGNOSIS — Z122 Encounter for screening for malignant neoplasm of respiratory organs: Secondary | ICD-10-CM

## 2022-10-26 ENCOUNTER — Encounter: Payer: Self-pay | Admitting: Acute Care

## 2022-10-26 ENCOUNTER — Ambulatory Visit (INDEPENDENT_AMBULATORY_CARE_PROVIDER_SITE_OTHER): Payer: Medicare PPO | Admitting: Acute Care

## 2022-10-26 DIAGNOSIS — F17210 Nicotine dependence, cigarettes, uncomplicated: Secondary | ICD-10-CM | POA: Diagnosis not present

## 2022-10-26 NOTE — Patient Instructions (Signed)
Thank you for participating in the [place] [facility]. It was our pleasure to meet you today. We will call you with the results of your scan within the next few days. Your scan will be assigned a Lung RADS category score by the physicians reading the scans.  This Lung RADS score determines follow up scanning.  See below for description of categories, and follow up screening recommendations. We will be in touch to schedule your follow up screening annually or based on recommendations of our providers. We will fax a copy of your scan results to your Primary Care Physician, or the physician who referred you to the program, to ensure they have the results. Please call the office if you have any questions or concerns regarding your scanning experience or results.  Our office number is [phone]. Please speak with Denise Phelps, RN. , or  Denise Buckner RN, They are  our Lung Cancer Screening RN.'s If They are unavailable when you call, Please leave a message on the voice mail. We will return your call at our earliest convenience.This voice mail is monitored several times a day.  Remember, if your scan is normal, we will scan you annually as long as you continue to meet the criteria for the program. (Age 55-77, Current smoker or smoker who has quit within the last 15 years). If you are a smoker, remember, quitting is the single most powerful action that you can take to decrease your risk of lung cancer and other pulmonary, breathing related problems. We know quitting is hard, and we are here to help.  Please let us know if there is anything we can do to help you meet your goal of quitting. If you are a former smoker, congratulations. We are proud of you! Remain smoke free! Remember you can refer friends or family members through the number above.  We will screen them to make sure they meet criteria for the program. Thank you for helping us take better care of you by  participating in Lung Screening.  You can receive free nicotine replacement therapy ( patches, gum or mints) by calling 1-800-QUIT NOW. Please call so we can get you on the path to becoming  a non-smoker. I know it is hard, but you can do this!  Lung RADS Categories:  Lung RADS 1: no nodules or definitely non-concerning nodules.  Recommendation is for a repeat annual scan in 12 months.  Lung RADS 2:  nodules that are non-concerning in appearance and behavior with a very low likelihood of becoming an active cancer. Recommendation is for a repeat annual scan in 12 months.  Lung RADS 3: nodules that are probably non-concerning , includes nodules with a low likelihood of becoming an active cancer.  Recommendation is for a 6-month repeat screening scan. Often noted after an upper respiratory illness. We will be in touch to make sure you have no questions, and to schedule your 6-month scan.  Lung RADS 4 A: nodules with concerning findings, recommendation is most often for a follow up scan in 3 months or additional testing based on our provider's assessment of the scan. We will be in touch to make sure you have no questions and to schedule the recommended 3 month follow up scan.  Lung RADS 4 B:  indicates findings that are concerning. We will be in touch with you to schedule additional diagnostic testing based on our provider's  assessment of the scan.  Other options for assistance in smoking cessation (   As covered by your insurance benefits)  Hypnosis for smoking cessation  [employer] [phone]  Acupuncture for smoking cessation  [facility] [phone]   

## 2022-10-26 NOTE — Progress Notes (Signed)
Virtual Visit via Telephone Note  I connected with Skyah Hannon Mahadeo on 10/26/22 at  8:30 AM EST by telephone and verified that I am speaking with the correct person using two identifiers.  Location: Patient:  At home Provider: [address], [place], [place], [address]    I discussed the limitations, risks, security and privacy concerns of performing an evaluation and management service by telephone and the availability of in person appointments. I also discussed with the patient that there may be a patient responsible charge related to this service. The patient expressed understanding and agreed to proceed.   Shared Decision Making Visit Lung Cancer Screening Program [phone])   Eligibility: Age 71 y.o. Pack Years Smoking History Calculation 20 pack year smoking history (# packs/per year x # years smoked) Recent History of coughing up blood  no Unexplained weight loss? no ( >Than 15 pounds within the last 6 months ) Prior History Lung / other cancer no (Diagnosis within the last 5 years already requiring surveillance chest CT Scans). Smoking Status Current Smoker Former Smokers: Years since quit:  NA  Quit Date:  NA  Visit Components: Discussion included one or more decision making aids. yes Discussion included risk/benefits of screening. yes Discussion included potential follow up diagnostic testing for abnormal scans. yes Discussion included meaning and risk of over diagnosis. yes Discussion included meaning and risk of False Positives. yes Discussion included meaning of total radiation exposure. yes  Counseling Included: Importance of adherence to annual lung cancer LDCT screening. yes Impact of comorbidities on ability to participate in the program. yes Ability and willingness to under diagnostic treatment. yes  Smoking Cessation Counseling: Current Smokers:  Discussed importance of smoking cessation. yes Information about tobacco cessation classes and  interventions provided to patient. yes Patient provided with "ticket" for LDCT Scan. yes Symptomatic Patient. no  Counseling NA Diagnosis Code: Tobacco Use Z72.0 Asymptomatic Patient yes  Counseling (Intermediate counseling: > three minutes counseling) L0786 Former Smokers:  Discussed the importance of maintaining cigarette abstinence. yes Diagnosis Code: Personal History of Nicotine Dependence. L54.492 Information about tobacco cessation classes and interventions provided to patient. Yes Patient provided with "ticket" for LDCT Scan. yes Written Order for Lung Cancer Screening with LDCT placed in Epic. Yes (CT Chest Lung Cancer Screening Low Dose W/O CM) [id number] Z12.2-Screening of respiratory organs Z87.891-Personal history of nicotine dependence  I have spent 25 minutes of face to face/ virtual visit   time with  Ms. Ulibarri discussing the risks and benefits of lung cancer screening. We viewed / discussed a power point together that explained in detail the above noted topics. We paused at intervals to allow for questions to be asked and answered to ensure understanding.We discussed that the single most powerful action that she can take to decrease her risk of developing lung cancer is to quit smoking. We discussed whether or not she is ready to commit to setting a quit date. We discussed options for tools to aid in quitting smoking including nicotine replacement therapy, non-nicotine medications, support groups, Quit Smart classes, and behavior modification. We discussed that often times setting smaller, more achievable goals, such as eliminating 1 cigarette a day for a week and then 2 cigarettes a day for a week can be helpful in slowly decreasing the number of cigarettes smoked. This allows for a sense of accomplishment as well as providing a clinical benefit. I provided  her  with smoking cessation  information  with contact information for community resources, classes,  free nicotine replacement  therapy, and access to mobile apps, text messaging, and on-line smoking cessation help. I have also provided  her  the office contact information in the event she needs to contact me, or the screening staff. We discussed the time and location of the scan, and that either Abigail Miyamoto RN, Karlton Lemon, RN  or I will call / send a letter with the results within 24-72 hours of receiving them. The patient verbalized understanding of all of  the above and had no further questions upon leaving the office. They have my contact information in the event they have any further questions.  I spent 3 minutes counseling on smoking cessation and the health risks of continued tobacco abuse.  I explained to the patient that there has been a high incidence of coronary artery disease noted on these exams. I explained that this is a non-gated exam therefore degree or severity cannot be determined. This patient is on statin therapy. I have asked the patient to follow-up with their PCP regarding any incidental finding of coronary artery disease and management with diet or medication as their PCP  feels is clinically indicated. The patient verbalized understanding of the above and had no further questions upon completion of the visit.      Bevelyn Ngo, NP  10/26/2022

## 2022-10-27 ENCOUNTER — Ambulatory Visit
Admission: RE | Admit: 2022-10-27 | Discharge: 2022-10-27 | Disposition: A | Discharge status: Home / Self Care | Payer: Medicare PPO | Source: Ambulatory Visit | Attending: Acute Care | Admitting: Acute Care

## 2022-10-27 DIAGNOSIS — F17210 Nicotine dependence, cigarettes, uncomplicated: Secondary | ICD-10-CM | POA: Diagnosis present

## 2022-10-27 DIAGNOSIS — Z87891 Personal history of nicotine dependence: Secondary | ICD-10-CM | POA: Diagnosis present

## 2022-10-27 DIAGNOSIS — Z122 Encounter for screening for malignant neoplasm of respiratory organs: Secondary | ICD-10-CM | POA: Diagnosis present

## 2022-11-01 ENCOUNTER — Telehealth: Payer: Self-pay | Admitting: Acute Care

## 2022-11-01 NOTE — Telephone Encounter (Signed)
Left VM and call back information to call for LDCT results

## 2022-11-08 NOTE — Telephone Encounter (Signed)
Letter mailed to home regarding LDCT results requesting patient call for review.

## 2022-11-10 ENCOUNTER — Telehealth: Payer: Self-pay | Admitting: Acute Care

## 2022-11-10 DIAGNOSIS — Z87891 Personal history of nicotine dependence: Secondary | ICD-10-CM

## 2022-11-10 DIAGNOSIS — R911 Solitary pulmonary nodule: Secondary | ICD-10-CM

## 2022-11-10 NOTE — Telephone Encounter (Signed)
CT results faxed to PCP with follow up plans included. Order placed for 6 month nodule f/u [facility]CS CT.  

## 2022-11-10 NOTE — Telephone Encounter (Signed)
I have called the patient with the results of her low-dose screening CT.  Her scan was read as a lung RADS 3 recommending at 57-month follow-up low-dose screening CT.  The patient has 2 small pulmonary nodules noted in both lungs largest of which is at the lateral segment of the right middle lobe which measures 7.2 mm, the other measures 6.5 mm both which followed to the lung RADS 3 category. Patient verbalized understanding and agreed with a 47-month follow-up low-dose screening CT to reevaluate the nodules for stability.  I told her we will call her closer to June to get her scheduled. Patient also has aortic atherosclerosis and emphysema.  She is a current every day smoker has been encouraged to quit. Denise please place order for 54-month low-dose screening CT, and fax results to PCP letting them know plan is for a 20-month follow-up.  Thanks so much

## 2023-04-28 ENCOUNTER — Ambulatory Visit: Payer: Medicare PPO | Attending: Acute Care

## 2024-07-24 ENCOUNTER — Other Ambulatory Visit: Payer: Self-pay | Admitting: Obstetrics and Gynecology

## 2024-07-24 DIAGNOSIS — Z1231 Encounter for screening mammogram for malignant neoplasm of breast: Secondary | ICD-10-CM

## 2024-07-24 DIAGNOSIS — Z780 Asymptomatic menopausal state: Secondary | ICD-10-CM

## 2024-07-31 ENCOUNTER — Other Ambulatory Visit: Payer: Self-pay

## 2024-07-31 DIAGNOSIS — Z87891 Personal history of nicotine dependence: Secondary | ICD-10-CM

## 2024-07-31 DIAGNOSIS — F17210 Nicotine dependence, cigarettes, uncomplicated: Secondary | ICD-10-CM

## 2024-07-31 DIAGNOSIS — Z122 Encounter for screening for malignant neoplasm of respiratory organs: Secondary | ICD-10-CM

## 2024-08-12 ENCOUNTER — Other Ambulatory Visit: Payer: Self-pay | Admitting: Obstetrics and Gynecology

## 2024-08-12 DIAGNOSIS — R918 Other nonspecific abnormal finding of lung field: Secondary | ICD-10-CM
